# Patient Record
Sex: Female | Born: 1985 | Race: Black or African American | Hispanic: No | Marital: Single | Smoking: Current every day smoker
Health system: Southern US, Community
[De-identification: ages and names within clinical notes are randomized; demographics above are authoritative.]

## PROBLEM LIST (undated history)

## (undated) DIAGNOSIS — F29 Unspecified psychosis not due to a substance or known physiological condition: Secondary | ICD-10-CM

## (undated) DIAGNOSIS — M542 Cervicalgia: Secondary | ICD-10-CM

## (undated) DIAGNOSIS — D219 Benign neoplasm of connective and other soft tissue, unspecified: Secondary | ICD-10-CM

## (undated) DIAGNOSIS — R87619 Unspecified abnormal cytological findings in specimens from cervix uteri: Secondary | ICD-10-CM

## (undated) DIAGNOSIS — N83209 Unspecified ovarian cyst, unspecified side: Secondary | ICD-10-CM

## (undated) DIAGNOSIS — S0990XA Unspecified injury of head, initial encounter: Secondary | ICD-10-CM

## (undated) DIAGNOSIS — K37 Unspecified appendicitis: Secondary | ICD-10-CM

## (undated) HISTORY — PX: KNEE SURGERY: SHX244

---

## 2005-11-12 ENCOUNTER — Emergency Department (HOSPITAL_COMMUNITY): Admission: EM | Admit: 2005-11-12 | Discharge: 2005-11-12 | Payer: Self-pay | Admitting: Emergency Medicine

## 2014-09-28 ENCOUNTER — Observation Stay (HOSPITAL_COMMUNITY)
Admission: EM | Admit: 2014-09-28 | Discharge: 2014-09-29 | Disposition: A | Discharge status: Home / Self Care | Payer: Self-pay | Attending: General Surgery | Admitting: General Surgery

## 2014-09-28 ENCOUNTER — Observation Stay (HOSPITAL_COMMUNITY): Payer: Self-pay | Admitting: Anesthesiology

## 2014-09-28 ENCOUNTER — Emergency Department (HOSPITAL_COMMUNITY): Payer: Self-pay

## 2014-09-28 ENCOUNTER — Encounter (HOSPITAL_COMMUNITY): Payer: Self-pay | Admitting: Family Medicine

## 2014-09-28 ENCOUNTER — Encounter (HOSPITAL_COMMUNITY)
Admission: EM | Disposition: A | Discharge status: Home / Self Care | Payer: Self-pay | Source: Home / Self Care | Attending: Emergency Medicine

## 2014-09-28 DIAGNOSIS — D72829 Elevated white blood cell count, unspecified: Secondary | ICD-10-CM | POA: Insufficient documentation

## 2014-09-28 DIAGNOSIS — E669 Obesity, unspecified: Secondary | ICD-10-CM | POA: Insufficient documentation

## 2014-09-28 DIAGNOSIS — Z9049 Acquired absence of other specified parts of digestive tract: Secondary | ICD-10-CM

## 2014-09-28 DIAGNOSIS — F17210 Nicotine dependence, cigarettes, uncomplicated: Secondary | ICD-10-CM | POA: Insufficient documentation

## 2014-09-28 DIAGNOSIS — K353 Acute appendicitis with localized peritonitis: Principal | ICD-10-CM | POA: Insufficient documentation

## 2014-09-28 DIAGNOSIS — Z6838 Body mass index (BMI) 38.0-38.9, adult: Secondary | ICD-10-CM | POA: Insufficient documentation

## 2014-09-28 DIAGNOSIS — K3580 Unspecified acute appendicitis: Secondary | ICD-10-CM | POA: Diagnosis present

## 2014-09-28 DIAGNOSIS — N946 Dysmenorrhea, unspecified: Secondary | ICD-10-CM | POA: Insufficient documentation

## 2014-09-28 HISTORY — PX: LAPAROSCOPIC APPENDECTOMY: SUR753

## 2014-09-28 HISTORY — DX: Unspecified ovarian cyst, unspecified side: N83.209

## 2014-09-28 HISTORY — DX: Benign neoplasm of connective and other soft tissue, unspecified: D21.9

## 2014-09-28 HISTORY — PX: LAPAROSCOPIC APPENDECTOMY: SHX408

## 2014-09-28 HISTORY — DX: Unspecified appendicitis: K37

## 2014-09-28 LAB — CBC
HCT: 40.5 % (ref 36.0–46.0)
Hemoglobin: 13.6 g/dL (ref 12.0–15.0)
MCH: 30.2 pg (ref 26.0–34.0)
MCHC: 33.6 g/dL (ref 30.0–36.0)
MCV: 89.8 fL (ref 78.0–100.0)
PLATELETS: 321 10*3/uL (ref 150–400)
RBC: 4.51 MIL/uL (ref 3.87–5.11)
RDW: 13.2 % (ref 11.5–15.5)
WBC: 13.8 10*3/uL — ABNORMAL HIGH (ref 4.0–10.5)

## 2014-09-28 LAB — URINE MICROSCOPIC-ADD ON

## 2014-09-28 LAB — URINALYSIS, ROUTINE W REFLEX MICROSCOPIC
BILIRUBIN URINE: NEGATIVE
GLUCOSE, UA: NEGATIVE mg/dL
Hgb urine dipstick: NEGATIVE
KETONES UR: 40 mg/dL — AB
Nitrite: NEGATIVE
PH: 6 (ref 5.0–8.0)
Protein, ur: 30 mg/dL — AB
Specific Gravity, Urine: 1.038 — ABNORMAL HIGH (ref 1.005–1.030)
Urobilinogen, UA: 1 mg/dL (ref 0.0–1.0)

## 2014-09-28 LAB — COMPREHENSIVE METABOLIC PANEL
ALT: 18 U/L (ref 14–54)
AST: 28 U/L (ref 15–41)
Albumin: 4.9 g/dL (ref 3.5–5.0)
Alkaline Phosphatase: 60 U/L (ref 38–126)
Anion gap: 10 (ref 5–15)
BUN: 12 mg/dL (ref 6–20)
CHLORIDE: 104 mmol/L (ref 101–111)
CO2: 23 mmol/L (ref 22–32)
CREATININE: 0.87 mg/dL (ref 0.44–1.00)
Calcium: 9.4 mg/dL (ref 8.9–10.3)
GFR calc Af Amer: >60 mL/min (ref >60)
Glucose, Bld: 120 mg/dL — ABNORMAL HIGH (ref 65–99)
Potassium: 4.2 mmol/L (ref 3.5–5.1)
Sodium: 137 mmol/L (ref 135–145)
Total Bilirubin: 0.7 mg/dL (ref 0.3–1.2)
Total Protein: 8.6 g/dL — ABNORMAL HIGH (ref 6.5–8.1)

## 2014-09-28 LAB — LIPASE, BLOOD: LIPASE: 17 U/L — AB (ref 22–51)

## 2014-09-28 LAB — POC URINE PREG, ED: Preg Test, Ur: NEGATIVE

## 2014-09-28 SURGERY — APPENDECTOMY, LAPAROSCOPIC
Anesthesia: General

## 2014-09-28 MED ORDER — NEOSTIGMINE METHYLSULFATE 10 MG/10ML IV SOLN
INTRAVENOUS | Status: DC | PRN
  Administered 2014-09-28: 4 mg via INTRAVENOUS

## 2014-09-28 MED ORDER — PIPERACILLIN-TAZOBACTAM 3.375 G IVPB
3.3750 g | Freq: Three times a day (TID) | INTRAVENOUS | Status: DC
  Filled 2014-09-28: qty 50

## 2014-09-28 MED ORDER — DEXAMETHASONE SODIUM PHOSPHATE 10 MG/ML IJ SOLN
INTRAMUSCULAR | Status: DC | PRN
  Administered 2014-09-28: 10 mg via INTRAVENOUS

## 2014-09-28 MED ORDER — HYDROMORPHONE HCL 1 MG/ML IJ SOLN
INTRAMUSCULAR | Status: AC
  Filled 2014-09-28: qty 1

## 2014-09-28 MED ORDER — POTASSIUM CHLORIDE IN NACL 20-0.9 MEQ/L-% IV SOLN
INTRAVENOUS | Status: DC
  Filled 2014-09-28 (×2): qty 1000

## 2014-09-28 MED ORDER — LACTATED RINGERS IV SOLN
INTRAVENOUS | Status: DC
Start: 2014-09-28 — End: 2014-09-28

## 2014-09-28 MED ORDER — ROCURONIUM BROMIDE 100 MG/10ML IV SOLN
INTRAVENOUS | Status: AC
  Filled 2014-09-28: qty 1

## 2014-09-28 MED ORDER — BUPIVACAINE LIPOSOME 1.3 % IJ SUSP
20.0000 mL | Freq: Once | INTRAMUSCULAR | Status: DC
  Filled 2014-09-28: qty 20

## 2014-09-28 MED ORDER — HEPARIN SODIUM (PORCINE) 5000 UNIT/ML IJ SOLN
5000.0000 [IU] | Freq: Three times a day (TID) | INTRAMUSCULAR | Status: DC
  Administered 2014-09-28 – 2014-09-29 (×2): 5000 [IU] via SUBCUTANEOUS
  Filled 2014-09-28 (×5): qty 1

## 2014-09-28 MED ORDER — MIDAZOLAM HCL 5 MG/5ML IJ SOLN
INTRAMUSCULAR | Status: DC | PRN
  Administered 2014-09-28: 2 mg via INTRAVENOUS

## 2014-09-28 MED ORDER — MORPHINE SULFATE (PF) 2 MG/ML IV SOLN
1.0000 mg | INTRAVENOUS | Status: DC | PRN
  Administered 2014-09-28 – 2014-09-29 (×4): 1 mg via INTRAVENOUS
  Filled 2014-09-28 (×4): qty 1

## 2014-09-28 MED ORDER — GLYCOPYRROLATE 0.2 MG/ML IJ SOLN
INTRAMUSCULAR | Status: AC
  Filled 2014-09-28: qty 4

## 2014-09-28 MED ORDER — LIDOCAINE HCL (CARDIAC) 20 MG/ML IV SOLN
INTRAVENOUS | Status: DC | PRN
  Administered 2014-09-28: 50 mg via INTRAVENOUS

## 2014-09-28 MED ORDER — DEXAMETHASONE SODIUM PHOSPHATE 10 MG/ML IJ SOLN
INTRAMUSCULAR | Status: AC
  Filled 2014-09-28: qty 1

## 2014-09-28 MED ORDER — ONDANSETRON HCL 4 MG/2ML IJ SOLN
INTRAMUSCULAR | Status: AC
  Filled 2014-09-28: qty 2

## 2014-09-28 MED ORDER — PIPERACILLIN-TAZOBACTAM 3.375 G IVPB 30 MIN
3.3750 g | Freq: Three times a day (TID) | INTRAVENOUS | Status: AC
  Administered 2014-09-28: 3.375 g via INTRAVENOUS
  Filled 2014-09-28: qty 50

## 2014-09-28 MED ORDER — HYDROMORPHONE HCL 1 MG/ML IJ SOLN
1.0000 mg | Freq: Once | INTRAMUSCULAR | Status: AC
  Administered 2014-09-28: 1 mg via INTRAVENOUS
  Filled 2014-09-28: qty 1

## 2014-09-28 MED ORDER — HYDROMORPHONE HCL 2 MG/ML IJ SOLN
INTRAMUSCULAR | Status: AC
  Filled 2014-09-28: qty 1

## 2014-09-28 MED ORDER — ONDANSETRON HCL 4 MG/2ML IJ SOLN
4.0000 mg | Freq: Four times a day (QID) | INTRAMUSCULAR | Status: DC | PRN
  Administered 2014-09-28: 4 mg via INTRAVENOUS

## 2014-09-28 MED ORDER — PIPERACILLIN-TAZOBACTAM 3.375 G IVPB
3.3750 g | Freq: Once | INTRAVENOUS | Status: AC
  Administered 2014-09-28: 3.375 g via INTRAVENOUS
  Filled 2014-09-28: qty 50

## 2014-09-28 MED ORDER — SODIUM CHLORIDE 0.9 % IV BOLUS (SEPSIS)
1000.0000 mL | Freq: Once | INTRAVENOUS | Status: AC
  Administered 2014-09-28: 1000 mL via INTRAVENOUS

## 2014-09-28 MED ORDER — LACTATED RINGERS IR SOLN
Status: DC | PRN
  Administered 2014-09-28: 1000 mL

## 2014-09-28 MED ORDER — METOCLOPRAMIDE HCL 5 MG/ML IJ SOLN
10.0000 mg | INTRAMUSCULAR | Status: AC
  Administered 2014-09-28: 10 mg via INTRAVENOUS
  Filled 2014-09-28: qty 2

## 2014-09-28 MED ORDER — MORPHINE SULFATE (PF) 10 MG/ML IV SOLN: 1.0000 mg | INTRAVENOUS | Status: DC | PRN

## 2014-09-28 MED ORDER — HYDROMORPHONE HCL 1 MG/ML IJ SOLN
0.2500 mg | INTRAMUSCULAR | Status: DC | PRN
  Administered 2014-09-28 (×2): 0.5 mg via INTRAVENOUS

## 2014-09-28 MED ORDER — HYDROCODONE-ACETAMINOPHEN 5-325 MG PO TABS
1.0000 | ORAL_TABLET | ORAL | Status: DC | PRN
  Administered 2014-09-28 – 2014-09-29 (×3): 2 via ORAL
  Filled 2014-09-28 (×3): qty 2

## 2014-09-28 MED ORDER — PROPOFOL 10 MG/ML IV BOLUS
INTRAVENOUS | Status: AC
  Filled 2014-09-28: qty 20

## 2014-09-28 MED ORDER — HYDROMORPHONE HCL 1 MG/ML IJ SOLN
0.5000 mg | INTRAMUSCULAR | Status: DC | PRN
  Administered 2014-09-28: 1 mg via INTRAVENOUS
  Administered 2014-09-28: 0.5 mg via INTRAVENOUS

## 2014-09-28 MED ORDER — BUPIVACAINE LIPOSOME 1.3 % IJ SUSP
INTRAMUSCULAR | Status: DC | PRN
  Administered 2014-09-28: 20 mL

## 2014-09-28 MED ORDER — PANTOPRAZOLE SODIUM 40 MG IV SOLR
40.0000 mg | Freq: Every day | INTRAVENOUS | Status: DC
  Administered 2014-09-28: 40 mg via INTRAVENOUS
  Filled 2014-09-28 (×2): qty 40

## 2014-09-28 MED ORDER — ONDANSETRON 4 MG PO TBDP: 4.0000 mg | ORAL_TABLET | Freq: Four times a day (QID) | ORAL | Status: DC | PRN

## 2014-09-28 MED ORDER — 0.9 % SODIUM CHLORIDE (POUR BTL) OPTIME
TOPICAL | Status: DC | PRN
  Administered 2014-09-28: 1000 mL

## 2014-09-28 MED ORDER — KCL IN DEXTROSE-NACL 20-5-0.45 MEQ/L-%-% IV SOLN
INTRAVENOUS | Status: DC
  Administered 2014-09-28: 16:00:00 via INTRAVENOUS
  Filled 2014-09-28 (×3): qty 1000

## 2014-09-28 MED ORDER — ONDANSETRON HCL 4 MG/2ML IJ SOLN
4.0000 mg | Freq: Four times a day (QID) | INTRAMUSCULAR | Status: DC | PRN
  Administered 2014-09-28: 4 mg via INTRAVENOUS
  Filled 2014-09-28: qty 2

## 2014-09-28 MED ORDER — ROCURONIUM BROMIDE 100 MG/10ML IV SOLN
INTRAVENOUS | Status: DC | PRN
  Administered 2014-09-28: 40 mg via INTRAVENOUS

## 2014-09-28 MED ORDER — FENTANYL CITRATE (PF) 250 MCG/5ML IJ SOLN
INTRAMUSCULAR | Status: AC
  Filled 2014-09-28: qty 25

## 2014-09-28 MED ORDER — LACTATED RINGERS IV SOLN
INTRAVENOUS | Status: DC
  Administered 2014-09-28: 1000 mL via INTRAVENOUS

## 2014-09-28 MED ORDER — IOHEXOL 300 MG/ML  SOLN
50.0000 mL | Freq: Once | INTRAMUSCULAR | Status: AC | PRN
  Administered 2014-09-28: 50 mL via ORAL

## 2014-09-28 MED ORDER — FENTANYL CITRATE (PF) 100 MCG/2ML IJ SOLN
INTRAMUSCULAR | Status: DC | PRN
  Administered 2014-09-28 (×2): 50 ug via INTRAVENOUS
  Administered 2014-09-28: 100 ug via INTRAVENOUS
  Administered 2014-09-28: 50 ug via INTRAVENOUS

## 2014-09-28 MED ORDER — IOHEXOL 300 MG/ML  SOLN
100.0000 mL | Freq: Once | INTRAMUSCULAR | Status: AC | PRN
  Administered 2014-09-28: 100 mL via INTRAVENOUS

## 2014-09-28 MED ORDER — KETOROLAC TROMETHAMINE 60 MG/2ML IM SOLN
60.0000 mg | Freq: Once | INTRAMUSCULAR | Status: AC
  Administered 2014-09-28: 60 mg via INTRAMUSCULAR
  Filled 2014-09-28: qty 2

## 2014-09-28 MED ORDER — LIDOCAINE HCL (CARDIAC) 20 MG/ML IV SOLN
INTRAVENOUS | Status: AC
  Filled 2014-09-28: qty 5

## 2014-09-28 MED ORDER — ONDANSETRON 4 MG PO TBDP
4.0000 mg | ORAL_TABLET | Freq: Once | ORAL | Status: AC | PRN
  Administered 2014-09-28: 4 mg via ORAL
  Filled 2014-09-28: qty 1

## 2014-09-28 MED ORDER — MIDAZOLAM HCL 2 MG/2ML IJ SOLN
INTRAMUSCULAR | Status: AC
  Filled 2014-09-28: qty 4

## 2014-09-28 MED ORDER — GLYCOPYRROLATE 0.2 MG/ML IJ SOLN
INTRAMUSCULAR | Status: DC | PRN
  Administered 2014-09-28: .8 mg via INTRAVENOUS

## 2014-09-28 MED ORDER — PROPOFOL 10 MG/ML IV BOLUS
INTRAVENOUS | Status: DC | PRN
  Administered 2014-09-28: 150 mg via INTRAVENOUS

## 2014-09-28 MED ORDER — SUCCINYLCHOLINE CHLORIDE 20 MG/ML IJ SOLN
INTRAMUSCULAR | Status: DC | PRN
  Administered 2014-09-28: 100 mg via INTRAVENOUS

## 2014-09-28 SURGICAL SUPPLY — 40 items
APPLIER CLIP ROT 10 11.4 M/L (STAPLE)
APR CLP MED LRG 11.4X10 (STAPLE)
BAG SPEC RTRVL 10 TROC 200 (ENDOMECHANICALS)
BAG SPEC RTRVL LRG 6X4 10 (ENDOMECHANICALS)
CABLE HIGH FREQUENCY MONO STRZ (ELECTRODE) IMPLANT
CLIP APPLIE ROT 10 11.4 M/L (STAPLE) IMPLANT
COVER SURGICAL LIGHT HANDLE (MISCELLANEOUS) ×3 IMPLANT
CUTTER FLEX LINEAR 45M (STAPLE) ×2 IMPLANT
DECANTER SPIKE VIAL GLASS SM (MISCELLANEOUS) ×1 IMPLANT
DRAPE LAPAROSCOPIC ABDOMINAL (DRAPES) ×3 IMPLANT
ELECT REM PT RETURN 9FT ADLT (ELECTROSURGICAL) ×3
ELECTRODE REM PT RTRN 9FT ADLT (ELECTROSURGICAL) ×1 IMPLANT
ENDOLOOP SUT PDS II  0 18 (SUTURE)
ENDOLOOP SUT PDS II 0 18 (SUTURE) IMPLANT
GLOVE BIOGEL M 8.0 STRL (GLOVE) ×3 IMPLANT
GOWN STRL REUS W/TWL XL LVL3 (GOWN DISPOSABLE) ×6 IMPLANT
HOVERMATT SINGLE USE (MISCELLANEOUS) ×2 IMPLANT
KIT BASIN OR (CUSTOM PROCEDURE TRAY) ×3 IMPLANT
LIQUID BAND (GAUZE/BANDAGES/DRESSINGS) ×2 IMPLANT
POUCH RETRIEVAL ECOSAC 10 (ENDOMECHANICALS) IMPLANT
POUCH RETRIEVAL ECOSAC 10MM (ENDOMECHANICALS)
POUCH SPECIMEN RETRIEVAL 10MM (ENDOMECHANICALS) IMPLANT
RELOAD 45 VASCULAR/THIN (ENDOMECHANICALS) ×3 IMPLANT
RELOAD STAPLE 45 2.5 WHT GRN (ENDOMECHANICALS) IMPLANT
RELOAD STAPLE 45 3.5 BLU ETS (ENDOMECHANICALS) IMPLANT
RELOAD STAPLE TA45 3.5 REG BLU (ENDOMECHANICALS) IMPLANT
SCISSORS LAP 5X45 EPIX DISP (ENDOMECHANICALS) ×2 IMPLANT
SCRUB PCMX 4 OZ (MISCELLANEOUS) ×3 IMPLANT
SET IRRIG TUBING LAPAROSCOPIC (IRRIGATION / IRRIGATOR) ×3 IMPLANT
SHEARS CURVED HARMONIC AC 45CM (MISCELLANEOUS) ×3 IMPLANT
SLEEVE XCEL OPT CAN 5 100 (ENDOMECHANICALS) IMPLANT
STAPLER VISISTAT 35W (STAPLE) IMPLANT
SUT VIC AB 4-0 SH 18 (SUTURE) ×3 IMPLANT
SUT VICRYL 0 UR6 27IN ABS (SUTURE) ×2 IMPLANT
TOWEL OR 17X26 10 PK STRL BLUE (TOWEL DISPOSABLE) ×6 IMPLANT
TRAY FOLEY W/METER SILVER 14FR (SET/KITS/TRAYS/PACK) ×3 IMPLANT
TRAY LAPAROSCOPIC (CUSTOM PROCEDURE TRAY) ×3 IMPLANT
TROCAR BLADELESS OPT 5 100 (ENDOMECHANICALS) ×3 IMPLANT
TROCAR XCEL BLUNT TIP 100MML (ENDOMECHANICALS) ×3 IMPLANT
TROCAR XCEL NON-BLD 11X100MML (ENDOMECHANICALS) IMPLANT

## 2014-09-28 NOTE — H&P (Signed)
Judy Richardson is an 29 y.o. female.   Chief Complaint: Abdominal pain, nausea and vomting PCP:  None  HPI: Pt is a 29 y/o female who has pain lasting up to 2 days every couple months with her menstrual periods.She says these last 1-2 days and she just gets thru it although she says sometimes they get so bad she can't get up.  Her pain started Wed 09/23/14, and she thought she was just having a bad period.  She works in a warehouse, and felt some of it might just be from being tired. She has not been able to eat much since it started on 09/23/14.  She says the pain comes and goes. She says she has been tired a irritable, so she tried to get some extra sleep and just get thru it.   Pain is primarily in the LLQ, that is also where she usually has menstrual pain. She tried to eat some dinner yesterday and developed nausea and vomiting.  She says this is a little different than other cycle type pain.  She has also had some urgency but unable to void much with the feeling. She has had some constipation followed by loose stools with this also and that is a new symptom.  She finally presented to the ED here at [facility] last PM.  Work up shows she is afebrile, VSS, labs show her WBC is 13.8, UA shows many WBC.  CT scan shows: the appendix is dilated measuring 1.2 cm, fluid-filled with periappendiceal inflammatory change proximally. The appendix courses medially with tip in the deep pelvis. Small appendicoliths noted within the appendiceal lumen. No adjacent fluid collection or extraluminal air to suggest perforation or abscess. There is a 4 cm rounded myometrial lesion arising from the posterior uterine fundus, likely fibroid. Questionable 1.8 cm cyst in the left ovary, right ovary not discretely visualized. There is a small amount of free fluid in the pelvis.  Subcapsular 12 mm hypodense lesion in the right lobe of the liver. This is incompletely characterize, may reflect a hemangioma. We are ask to see.  Past Medical History   Diagnosis Date  . Ovarian cyst     Past Surgical History  Procedure Laterality Date  . Knee surgery Right     History reviewed. No pertinent family history. Social History:  reports that she has been smoking Cigarettes.  She does not have any smokeless tobacco history on file. She reports that she drinks alcohol. She reports that she uses illicit drugs (Marijuana). Tobacco:  Since age 33 ETOH:  Social Drugs:  Frequent Marijuana  Works in a [employer].  Allergies: No Known Allergies  Prior to Admission medications   None      Results for orders placed or performed during the hospital encounter of 09/28/14 (from the past 48 hour(s))  Lipase, blood     Status: Abnormal   Collection Time: 09/28/14  2:33 AM  Result Value Ref Range   Lipase 17 (L) 22 - 51 U/L  Comprehensive metabolic panel     Status: Abnormal   Collection Time: 09/28/14  2:33 AM  Result Value Ref Range   Sodium 137 135 - 145 mmol/L   Potassium 4.2 3.5 - 5.1 mmol/L   Chloride 104 101 - 111 mmol/L   CO2 23 22 - 32 mmol/L   Glucose, Bld 120 (H) 65 - 99 mg/dL   BUN 12 6 - 20 mg/dL   Creatinine, Ser 0.87 0.44 - 1.00 mg/dL   Calcium 9.4 8.9 -  10.3 mg/dL   Total Protein 8.6 (H) 6.5 - 8.1 g/dL   Albumin 4.9 3.5 - 5.0 g/dL   AST 28 15 - 41 U/L   ALT 18 14 - 54 U/L   Alkaline Phosphatase 60 38 - 126 U/L   Total Bilirubin 0.7 0.3 - 1.2 mg/dL   GFR calc non Af Amer >60 >60 mL/min   GFR calc Af Amer >60 >60 mL/min    Comment: (NOTE) The eGFR has been calculated using the CKD EPI equation. This calculation has not been validated in all clinical situations. eGFR's persistently <60 mL/min signify possible Chronic Kidney Disease.    Anion gap 10 5 - 15  CBC     Status: Abnormal   Collection Time: 09/28/14  2:33 AM  Result Value Ref Range   WBC 13.8 (H) 4.0 - 10.5 K/uL   RBC 4.51 3.87 - 5.11 MIL/uL   Hemoglobin 13.6 12.0 - 15.0 g/dL   HCT 40.5 36.0 - 46.0 %   MCV 89.8 78.0 - 100.0 fL   MCH 30.2 26.0 - 34.0 pg    MCHC 33.6 30.0 - 36.0 g/dL   RDW 13.2 11.5 - 15.5 %   Platelets 321 150 - 400 K/uL  Urinalysis, Routine w reflex microscopic (not at [facility])     Status: Abnormal   Collection Time: 09/28/14  4:57 AM  Result Value Ref Range   Color, Urine AMBER (A) YELLOW    Comment: BIOCHEMICALS MAY BE AFFECTED BY COLOR   APPearance TURBID (A) CLEAR   Specific Gravity, Urine 1.038 (H) 1.005 - 1.030   pH 6.0 5.0 - 8.0   Glucose, UA NEGATIVE NEGATIVE mg/dL   Hgb urine dipstick NEGATIVE NEGATIVE   Bilirubin Urine NEGATIVE NEGATIVE   Ketones, ur 40 (A) NEGATIVE mg/dL   Protein, ur 30 (A) NEGATIVE mg/dL   Urobilinogen, UA 1.0 0.0 - 1.0 mg/dL   Nitrite NEGATIVE NEGATIVE   Leukocytes, UA SMALL (A) NEGATIVE  Urine microscopic-add on     Status: Abnormal   Collection Time: 09/28/14  4:57 AM  Result Value Ref Range   Squamous Epithelial / LPF MANY (A) RARE   WBC, UA 21-50 <3 WBC/hpf   Bacteria, UA FEW (A) RARE   Urine-Other MUCOUS PRESENT   POC urine preg, ED (not at [facility])     Status: None   Collection Time: 09/28/14  5:02 AM  Result Value Ref Range   Preg Test, Ur NEGATIVE NEGATIVE    Comment:        THE SENSITIVITY OF THIS METHODOLOGY IS >24 mIU/mL    Ct Abdomen Pelvis W Contrast  09/28/2014   CLINICAL DATA:  Abdominal pain. Left lower quadrant and low back pain, intermittent for 4 days. Nausea and vomiting. Weight loss.  EXAM: CT ABDOMEN AND PELVIS WITH CONTRAST  TECHNIQUE: Multidetector CT imaging of the abdomen and pelvis was performed using the standard protocol following bolus administration of intravenous contrast.  CONTRAST:  127m OMNIPAQUE IOHEXOL 300 MG/ML  SOLN  COMPARISON:  None.  FINDINGS: Lower chest:  The included lung bases are clear.  Liver: Subcapsular 12 mm hypodensity in the right lobe of the liver, incompletely characterized. Liver is otherwise normal in size and density.  Hepatobiliary: Gallbladder physiologically distended. No biliary dilatation.  Pancreas: Normal.  Spleen: Normal.   Adrenal glands: No nodule.  Kidneys: Symmetric renal enhancement. No hydronephrosis. No localizing renal abnormality.  Stomach/Bowel: Stomach physiologically distended. There are no dilated or thickened small bowel loops. Small volume of  stool throughout the colon without colonic wall thickening.  Appendix: Dilated measuring 1.2 cm, fluid-filled with periappendiceal inflammatory change proximally. The appendix courses medially with tip in the deep pelvis. Small appendicoliths noted within the appendiceal lumen. No adjacent fluid collection or extraluminal air to suggest perforation or abscess.  Vascular/Lymphatic: No retroperitoneal adenopathy. Abdominal aorta is normal in caliber.  Reproductive: There is a 4 cm rounded myometrial lesion arising from the posterior uterine fundus, likely fibroid. Questionable 1.8 cm cyst in the left ovary, right ovary not discretely visualized. There is a small amount of free fluid in the pelvis.  Bladder: Decompressed.  Other: No free air or intra-abdominal fluid collection.  Musculoskeletal: There are no acute or suspicious osseous abnormalities.  IMPRESSION: 1. Findings consistent with acute appendicitis.  No perforation. 2. Probable uterine fibroid. 3. Subcapsular 12 mm hypodense lesion in the right lobe of the liver. This is incompletely characterize, may reflect a hemangioma. Nonemergent characterization with hepatic protocol MRI recommended after acute incident has resolved.   Electronically Signed   By: Jeb Levering M.D.   On: 09/28/2014 06:41    Review of Systems  Constitutional: Positive for fever (she did not measure at home but felt feverish), chills and weight loss (7-10 labs last 2 weeks). Negative for malaise/fatigue and diaphoresis.  HENT: Negative.   Eyes: Negative.   Respiratory: Negative.   Cardiovascular: Negative.   Gastrointestinal: Positive for heartburn (occasional, depending on what she eats), nausea, vomiting, abdominal pain and constipation  (New issue followed by loose stools.).  Genitourinary: Positive for urgency and frequency.       She feels like she has to go, but small volumes.  Musculoskeletal: Positive for back pain.  Skin: Negative.   Neurological: Negative.  Negative for weakness.  Endo/Heme/Allergies: Negative.   Psychiatric/Behavioral: Negative.     Blood pressure 108/57, pulse 73, temperature 98.1 F (36.7 C), temperature source Oral, resp. rate 20, height '5\' 5"'  (1.651 m), weight 104.327 kg (230 lb), last menstrual period 08/30/2014, SpO2 99 %. Physical Exam  Constitutional: She is oriented to person, place, and time. She appears well-developed and well-nourished. No distress.  HENT:  Head: Normocephalic and atraumatic.  Nose: Nose normal.  Eyes: Conjunctivae and EOM are normal. Right eye exhibits no discharge. Left eye exhibits no discharge. No scleral icterus.  Neck: Normal range of motion. Neck supple. No JVD present. No tracheal deviation present. No thyromegaly present.  Cardiovascular: Normal rate, regular rhythm and intact distal pulses.   No murmur heard. Respiratory: Effort normal and breath sounds normal. No respiratory distress. She has no wheezes. She has no rales. She exhibits no tenderness.  GI: Soft. She exhibits no distension and no mass. There is tenderness. There is no rebound and no guarding.  She is tender all over, left and right lower quadrants are the worst, she points to the left side more than the right.  Musculoskeletal: She exhibits no edema.  Lymphadenopathy:    She has no cervical adenopathy.  Neurological: She is alert and oriented to person, place, and time. No cranial nerve deficit.  Skin: Skin is warm and dry. No rash noted. She is not diaphoretic. No erythema. No pallor.  Psychiatric: She has a normal mood and affect. Her behavior is normal. Judgment and thought content normal.     Assessment/Plan Acute appendicitis UTI Chronic dysmenorrhea Probable uterine fibroid and  left ovarian cyst Tobacco use  Plan:  Admit for acute appendicitis and probable UTI.  We had started her on Zosyn  in the ED.  We will hydrate, and take to the OR later today.  Urine culture is pending.  Dr. Hassell Done has seen and examined her.  He discussed surgery along with the risk and benefits of surgery.  JENNINGS,WILLARD 09/28/2014, 7:56 AM   I have seen and examined this patient and agree with the assessment and plan.   Matt B. Hassell Done, MD, [facility] [phone] beeper [phone]  09/28/2014 11:09 AM

## 2014-09-28 NOTE — Transfer of Care (Signed)
Immediate Anesthesia Transfer of Care Note  Patient: Judy Richardson  Procedure(s) Performed: Procedure(s): APPENDECTOMY LAPAROSCOPIC (N/A)  Patient Location: PACU  Anesthesia Type:General  Level of Consciousness:  sedated, patient cooperative and responds to stimulation  Airway & Oxygen Therapy:Patient Spontanous Breathing and Patient connected to face mask oxgen  Post-op Assessment:  Report given to PACU RN and Post -op Vital signs reviewed and stable  Post vital signs:  Reviewed and stable  Last Vitals:  Filed Vitals:   09/28/14 1000  BP: 110/55  Pulse: 82  Temp: 36.8 C  Resp: 16    Complications: No apparent anesthesia complications

## 2014-09-28 NOTE — Anesthesia Procedure Notes (Signed)
Procedure Name: Intubation Date/Time: 09/28/2014 12:00 PM Performed by: Anne Fu Pre-anesthesia Checklist: Patient identified, Emergency Drugs available, Suction available, Patient being monitored and Timeout performed Patient Re-evaluated:Patient Re-evaluated prior to inductionOxygen Delivery Method: Circle system utilized Preoxygenation: Pre-oxygenation with 100% oxygen Intubation Type: IV induction Ventilation: Mask ventilation without difficulty Laryngoscope Size: Mac and 3 Grade View: Grade I Tube type: Oral Tube size: 7.5 mm Number of attempts: 1 Airway Equipment and Method: Stylet Placement Confirmation: ETT inserted through vocal cords under direct vision,  positive ETCO2,  CO2 detector and breath sounds checked- equal and bilateral Secured at: 21 cm Tube secured with: Tape Dental Injury: Teeth and Oropharynx as per pre-operative assessment

## 2014-09-28 NOTE — ED Notes (Signed)
Pt aware that a urine sample is needed. Pt sts she will go after pain meds kick in.

## 2014-09-28 NOTE — ED Notes (Signed)
Surgery at bedside.

## 2014-09-28 NOTE — ED Notes (Signed)
Patient is complaining of left lower abd pain that has been intermittently painful since Wednesday. Also, complaining lower back pain, nausea, and vomiting.

## 2014-09-28 NOTE — Anesthesia Preprocedure Evaluation (Signed)
Anesthesia Evaluation  Patient identified by MRN, date of birth, ID band Patient awake    Reviewed: Allergy & Precautions, H&P , NPO status , Patient's Chart, lab work & pertinent test results  Airway Mallampati: II  TM Distance: >3 FB Neck ROM: full    Dental no notable dental hx. (+) Dental Advisory Given, Teeth Intact   Pulmonary neg pulmonary ROS, Current Smoker,    Pulmonary exam normal breath sounds clear to auscultation       Cardiovascular Exercise Tolerance: Good negative cardio ROS Normal cardiovascular exam Rhythm:regular Rate:Normal     Neuro/Psych negative neurological ROS  negative psych ROS   GI/Hepatic negative GI ROS, Neg liver ROS,   Endo/Other  negative endocrine ROS  Renal/GU negative Renal ROS  negative genitourinary   Musculoskeletal   Abdominal   Peds  Hematology negative hematology ROS (+)   Anesthesia Other Findings   Reproductive/Obstetrics negative OB ROS                             Anesthesia Physical Anesthesia Plan  ASA: II and emergent  Anesthesia Plan: General   Post-op Pain Management:    Induction: Intravenous, Rapid sequence and Cricoid pressure planned  Airway Management Planned: Oral ETT  Additional Equipment:   Intra-op Plan:   Post-operative Plan: Extubation in OR  Informed Consent: I have reviewed the patients History and Physical, chart, labs and discussed the procedure including the risks, benefits and alternatives for the proposed anesthesia with the patient or authorized representative who has indicated his/her understanding and acceptance.   Dental Advisory Given  Plan Discussed with: CRNA and Surgeon  Anesthesia Plan Comments:         Anesthesia Quick Evaluation

## 2014-09-28 NOTE — ED Notes (Signed)
Spoke with Amy on 5W. Patient can be transported @ 0835.

## 2014-09-28 NOTE — Anesthesia Postprocedure Evaluation (Signed)
  Anesthesia Post-op Note  Patient: Judy Richardson  Procedure(s) Performed: Procedure(s) (LRB): APPENDECTOMY LAPAROSCOPIC (N/A)  Patient Location: PACU  Anesthesia Type: General  Level of Consciousness: awake and alert   Airway and Oxygen Therapy: Patient Spontanous Breathing  Post-op Pain: mild  Post-op Assessment: Post-op Vital signs reviewed, Patient's Cardiovascular Status Stable, Respiratory Function Stable, Patent Airway and No signs of Nausea or vomiting  Last Vitals:  Filed Vitals:   09/28/14 1429  BP: 121/65  Pulse: 79  Temp: 36.8 C  Resp: 16    Post-op Vital Signs: stable   Complications: No apparent anesthesia complications

## 2014-09-28 NOTE — ED Provider Notes (Signed)
CSN: [id number]     Arrival date & time 09/28/14  0100 History   First MD Initiated Contact with Patient 09/28/14 [phone]     Chief Complaint  Patient presents with  . Abdominal Pain     (Consider location/radiation/quality/duration/timing/severity/associated sxs/prior Treatment) HPI Comments: 29 year old female with a history of left-sided ovarian cyst presents to the emergency department for left lower abdominal pain. Pain has been intermittent over the past 5 days, but constant over the last 24 hours. Pain has been worsening since onset and associated with low back pain. Patient has experienced nausea and emesis since 2200 this evening. She has had approximately 5 episodes of emesis, all of which have been nonbloody. She reports some mild dysuria and denies any fever. She further denies shortness of breath, hematuria, vaginal complaints, melanoma, or hematochezia. Patient denies a history of abdominal surgeries. No medications taken prior to arrival for symptoms.  Patient is a 29 y.o. female presenting with abdominal pain. The history is provided by the patient and a relative. No language interpreter was used.  Abdominal Pain Associated symptoms: dysuria, nausea and vomiting   Associated symptoms: no diarrhea, no fever, no shortness of breath, no vaginal bleeding and no vaginal discharge     Past Medical History  Diagnosis Date  . Ovarian cyst    Past Surgical History  Procedure Laterality Date  . Knee surgery Right    History reviewed. No pertinent family history. Social History  Substance Use Topics  . Smoking status: Current Every Day Smoker    Types: Cigarettes  . Smokeless tobacco: None  . Alcohol Use: Yes     Comment: Once a month   OB History    No data available      Review of Systems  Constitutional: Negative for fever.  Respiratory: Negative for shortness of breath.   Gastrointestinal: Positive for nausea, vomiting and abdominal pain. Negative for diarrhea and  blood in stool.  Genitourinary: Positive for dysuria. Negative for vaginal bleeding and vaginal discharge.  Musculoskeletal: Positive for back pain.  Neurological: Negative for syncope.  All other systems reviewed and are negative.   Allergies  Review of patient's allergies indicates no known allergies.  Home Medications   Prior to Admission medications   Not on File   BP 108/57 mmHg  Pulse 73  Temp(Src) 98.1 F (36.7 C) (Oral)  Resp 20  Ht 5\' 5"  (1.651 m)  Wt 230 lb (104.327 kg)  BMI 38.27 kg/m2  SpO2 99%  LMP 08/30/2014   Physical Exam  Constitutional: She is oriented to person, place, and time. She appears well-developed and well-nourished. No distress.  Patient appears uncomfortable, rolling around in the exam room bed  HENT:  Head: Normocephalic and atraumatic.  Eyes: Conjunctivae and EOM are normal. No scleral icterus.  Neck: Normal range of motion.  Cardiovascular: Normal rate, regular rhythm and intact distal pulses.   Pulmonary/Chest: Effort normal. No respiratory distress. She has no wheezes.  Respirations even and unlabored  Abdominal: Soft. She exhibits no distension. There is tenderness. There is no rebound and no guarding.    Soft obese abdomen without masses or peritoneal signs. There is focal TTP in the RLQ and LLQ.   Musculoskeletal: Normal range of motion.  Neurological: She is alert and oriented to person, place, and time. She exhibits normal muscle tone. Coordination normal.  GCS 15. Speech is goal oriented. Patient moves extremities without ataxia.  Skin: Skin is warm and dry. No rash noted. She is not diaphoretic.  No erythema. No pallor.  Psychiatric: She has a normal mood and affect. Her behavior is normal.  Nursing note and vitals reviewed.   ED Course  Procedures (including critical care time) Labs Review Labs Reviewed  LIPASE, BLOOD - Abnormal; Notable for the following:    Lipase 17 (*)    All other components within normal limits    COMPREHENSIVE METABOLIC PANEL - Abnormal; Notable for the following:    Glucose, Bld 120 (*)    Total Protein 8.6 (*)    All other components within normal limits  CBC - Abnormal; Notable for the following:    WBC 13.8 (*)    All other components within normal limits  URINALYSIS, ROUTINE W REFLEX MICROSCOPIC (NOT AT [facility]) - Abnormal; Notable for the following:    Color, Urine AMBER (*)    APPearance TURBID (*)    Specific Gravity, Urine 1.038 (*)    Ketones, ur 40 (*)    Protein, ur 30 (*)    Leukocytes, UA SMALL (*)    All other components within normal limits  URINE MICROSCOPIC-ADD ON - Abnormal; Notable for the following:    Squamous Epithelial / LPF MANY (*)    Bacteria, UA FEW (*)    All other components within normal limits  URINE CULTURE  POC URINE PREG, ED    Imaging Review Ct Abdomen Pelvis W Contrast  09/28/2014   CLINICAL DATA:  Abdominal pain. Left lower quadrant and low back pain, intermittent for 4 days. Nausea and vomiting. Weight loss.  EXAM: CT ABDOMEN AND PELVIS WITH CONTRAST  TECHNIQUE: Multidetector CT imaging of the abdomen and pelvis was performed using the standard protocol following bolus administration of intravenous contrast.  CONTRAST:  166mL OMNIPAQUE IOHEXOL 300 MG/ML  SOLN  COMPARISON:  None.  FINDINGS: Lower chest:  The included lung bases are clear.  Liver: Subcapsular 12 mm hypodensity in the right lobe of the liver, incompletely characterized. Liver is otherwise normal in size and density.  Hepatobiliary: Gallbladder physiologically distended. No biliary dilatation.  Pancreas: Normal.  Spleen: Normal.  Adrenal glands: No nodule.  Kidneys: Symmetric renal enhancement. No hydronephrosis. No localizing renal abnormality.  Stomach/Bowel: Stomach physiologically distended. There are no dilated or thickened small bowel loops. Small volume of stool throughout the colon without colonic wall thickening.  Appendix: Dilated measuring 1.2 cm, fluid-filled with  periappendiceal inflammatory change proximally. The appendix courses medially with tip in the deep pelvis. Small appendicoliths noted within the appendiceal lumen. No adjacent fluid collection or extraluminal air to suggest perforation or abscess.  Vascular/Lymphatic: No retroperitoneal adenopathy. Abdominal aorta is normal in caliber.  Reproductive: There is a 4 cm rounded myometrial lesion arising from the posterior uterine fundus, likely fibroid. Questionable 1.8 cm cyst in the left ovary, right ovary not discretely visualized. There is a small amount of free fluid in the pelvis.  Bladder: Decompressed.  Other: No free air or intra-abdominal fluid collection.  Musculoskeletal: There are no acute or suspicious osseous abnormalities.  IMPRESSION: 1. Findings consistent with acute appendicitis.  No perforation. 2. Probable uterine fibroid. 3. Subcapsular 12 mm hypodense lesion in the right lobe of the liver. This is incompletely characterize, may reflect a hemangioma. Nonemergent characterization with hepatic protocol MRI recommended after acute incident has resolved.   Electronically Signed   By: Jeb Levering M.D.   On: 09/28/2014 06:41     I have personally reviewed and evaluated these images and lab results as part of my medical decision-making.  EKG Interpretation None      MDM   Final diagnoses:  Acute appendicitis, unspecified acute appendicitis type    29 y/o female presents to the ED for evaluation of abdominal pain; waxing and waning x 5 days, but constant x 24 hours. Symptoms associated with nausea and emesis x 5 PTA. Pain controlled with Dilaudid. Patient afebrile. She has a leukocytosis of 13.8 today. CT shows acute appendicitis. Suspect that pyuria is associated with appendicitis. Patient has no urinary symptoms.  Case discussed with Dr. Marcello Moores of [place]. Patient to be evaluated by oncoming surgical team. Anticipate admission for laparoscopic appendectomy. Last PO intake (food and  fluids) was at 2000 yesterday.   Filed Vitals:   09/28/14 0118 09/28/14 0607  BP: 126/70 108/57  Pulse: 87 73  Temp: 98.1 F (36.7 C)   TempSrc: Oral   Resp: 18 20  Height: 5\' 5"  (1.651 m)   Weight: 230 lb (104.327 kg)   SpO2: 100% 99%     Antonietta Breach, PA-C 09/28/14 5449  Varney Biles, MD 09/28/14 0700

## 2014-09-28 NOTE — Op Note (Signed)
Surgeon: Kaylyn Lim, MD, FACS  Asst:  none  Anes:  general  Procedure: Laparoscopic appendectomy  Diagnosis: Acute appendicitis  Complications: none  EBL:   minimal cc  Drains: none  Description of Procedure:  The patient was taken to OR 6 at [facility].  After anesthesia was administered and the patient was prepped a timeout was performed.  Hasson placed through the umbilicus.  5 mm trocar placed in the right upper quadrant and left lower quadrant.  The appendix was mobilized from laterally and there was yellow cloudy fluid around it.  The mesentery of the appendix was divided with the harmonic.  The base of the appendix was isolated and transected with the Ethicon 4.5 mm stapler with a white load.  The appendix was removed through the umbilicus.  Ports were injected with Exparel.  The umbilicus was repaired with 2 sutures of 0 vicryl.  Liquiban was used on the skin.    The patient tolerated the procedure well and was taken to the PACU in stable condition.     Matt B. Hassell Done, [place], [facility], [place]

## 2014-09-29 ENCOUNTER — Encounter (HOSPITAL_COMMUNITY): Payer: Self-pay | Admitting: General Surgery

## 2014-09-29 DIAGNOSIS — K3580 Unspecified acute appendicitis: Secondary | ICD-10-CM | POA: Diagnosis present

## 2014-09-29 LAB — CBC
HEMATOCRIT: 35.9 % — AB (ref 36.0–46.0)
HEMOGLOBIN: 12.1 g/dL (ref 12.0–15.0)
MCH: 30.5 pg (ref 26.0–34.0)
MCHC: 33.7 g/dL (ref 30.0–36.0)
MCV: 90.4 fL (ref 78.0–100.0)
Platelets: 266 10*3/uL (ref 150–400)
RBC: 3.97 MIL/uL (ref 3.87–5.11)
RDW: 13.4 % (ref 11.5–15.5)
WBC: 11.8 10*3/uL — AB (ref 4.0–10.5)

## 2014-09-29 LAB — URINE CULTURE

## 2014-09-29 MED ORDER — KETOROLAC TROMETHAMINE 30 MG/ML IJ SOLN: 30.0000 mg | Freq: Four times a day (QID) | INTRAMUSCULAR | Status: DC

## 2014-09-29 MED ORDER — METHOCARBAMOL 500 MG PO TABS: 1000.0000 mg | ORAL_TABLET | Freq: Three times a day (TID) | ORAL | Status: DC | PRN

## 2014-09-29 MED ORDER — HYDROCODONE-ACETAMINOPHEN 5-325 MG PO TABS: 1.0000 | ORAL_TABLET | Freq: Four times a day (QID) | ORAL | Status: DC | PRN

## 2014-09-29 NOTE — Discharge Instructions (Signed)

## 2014-09-29 NOTE — Discharge Summary (Signed)
[facility] Discharge Summary   Patient ID: Judy Richardson MRN: [id number] DOB/AGE: 1985/06/17 29 y.o.  Admit date: 09/28/2014 Discharge date: 09/29/2014  Admitting Diagnosis: ACUTE APPENDICITIS  Discharge Diagnosis Patient Active Problem List   Diagnosis Date Noted  . Acute appendicitis 09/29/2014  . S/P laparoscopic appendectomy Sept 2016 09/28/2014    Consultants N/A  Imaging: Ct Abdomen Pelvis W Contrast  09/28/2014   CLINICAL DATA:  Abdominal pain. Left lower quadrant and low back pain, intermittent for 4 days. Nausea and vomiting. Weight loss.  EXAM: CT ABDOMEN AND PELVIS WITH CONTRAST  TECHNIQUE: Multidetector CT imaging of the abdomen and pelvis was performed using the standard protocol following bolus administration of intravenous contrast.  CONTRAST:  19mL OMNIPAQUE IOHEXOL 300 MG/ML  SOLN  COMPARISON:  None.  FINDINGS: Lower chest:  The included lung bases are clear.  Liver: Subcapsular 12 mm hypodensity in the right lobe of the liver, incompletely characterized. Liver is otherwise normal in size and density.  Hepatobiliary: Gallbladder physiologically distended. No biliary dilatation.  Pancreas: Normal.  Spleen: Normal.  Adrenal glands: No nodule.  Kidneys: Symmetric renal enhancement. No hydronephrosis. No localizing renal abnormality.  Stomach/Bowel: Stomach physiologically distended. There are no dilated or thickened small bowel loops. Small volume of stool throughout the colon without colonic wall thickening.  Appendix: Dilated measuring 1.2 cm, fluid-filled with periappendiceal inflammatory change proximally. The appendix courses medially with tip in the deep pelvis. Small appendicoliths noted within the appendiceal lumen. No adjacent fluid collection or extraluminal air to suggest perforation or abscess.  Vascular/Lymphatic: No retroperitoneal adenopathy. Abdominal aorta is normal in caliber.  Reproductive: There is a 4 cm rounded myometrial lesion arising from  the posterior uterine fundus, likely fibroid. Questionable 1.8 cm cyst in the left ovary, right ovary not discretely visualized. There is a small amount of free fluid in the pelvis.  Bladder: Decompressed.  Other: No free air or intra-abdominal fluid collection.  Musculoskeletal: There are no acute or suspicious osseous abnormalities.  IMPRESSION: 1. Findings consistent with acute appendicitis.  No perforation. 2. Probable uterine fibroid. 3. Subcapsular 12 mm hypodense lesion in the right lobe of the liver. This is incompletely characterize, may reflect a hemangioma. Nonemergent characterization with hepatic protocol MRI recommended after acute incident has resolved.   Electronically Signed   By: Jeb Levering M.D.   On: 09/28/2014 06:41    Procedures Dr. Johnathan Hausen (09/28/14) - Laparoscopic Appendectomy  Hospital Course:  28 y/o female with a PMH of dysmenorrhea presented to [facility] with worsening abdominal pain for 5 days, worsening the past 2 days. Her pain started Wed 09/23/14 and she thought she was just having a bad period. She works in a warehouse, and felt some of it might just be exhaustion. She has not been able to eat much since the pain started. She says the pain comes and goes. Pain at presentation was primarily in the LLQ, which is where she usually has menstrual pain. She described the pain as slightly "different" than the pain that she usually associates with her menstrual cycle. She developed nausea and vomiting on 09/27/14 after eating a meal. She also expressed new urinary urgency and constipation followed by loose stools.  ED Workup showed her to be febrile with WBC 13.8. UA positive for leukocytes. CT of abdomen and pelvis showed a dilated appendix measuring 1.2 cm, fluid-filled with periappendiceal inflammatory change proximally, and small appendicoliths noted within the appendiceal lumen. CT also showed a 4cm myometrial lesion on the posterior uterine  fundus and a questionable 1.8  cm cystic structure on the left ovary.  Patient was admitted and underwent procedure listed above.  Tolerated procedure well and was transferred to the floor. Diet was advanced as tolerated. On POD1, the patient was voiding well, tolerating diet, ambulating well, pain well controlled, vital signs stable, passing flatus, incisions c/d/i and felt stable for discharge home.  Patient will follow up in our office in 2 weeks and knows to call with questions or concerns.  She will call to confirm appointment date/time. A work note has been provided for her along with her post-op care instructions.  Patient was also encouraged to see her PCP for referral to an OB/Gyn in order to follow-up on menstrual pain and possible intrauterine fibroids.  Physical Exam: General:  Alert, NAD, pleasant, comfortable Abd: Soft, ND, mild tenderness around incision sites, incisions C/D/I CV: RRR, no m/r/g, radial pulses 2+ BL Pulm: CTABL, no wheezes, rales, or rhonchi    Medication List    TAKE these medications        HYDROcodone-acetaminophen 5-325 MG per tablet  Commonly known as:  NORCO/VICODIN  Take 1-2 tablets by mouth every 6 (six) hours as needed for moderate pain.     methocarbamol 500 MG tablet  Commonly known as:  ROBAXIN  Take 2 tablets (1,000 mg total) by mouth every 8 (eight) hours as needed for muscle spasms (or pain).            Follow-up Information    Follow up with [facility] on 10/13/2014.   Why:  For post-operation check.  Your appointment is at 1:30pm, please arrive at least 30 min before your appointment to complete your check in paperwork.  If you are unable to arrive 25min prior to your appointment time we may have to cancel or reschedule you   Contact information:   [address] [place] [address] [phone]      Signed: Rose Phi, PA-S Physician Assistant Student, [facility]  Surgery [phone]   09/29/2014, 8:54 AM

## 2014-09-29 NOTE — Progress Notes (Signed)
Patient's vital signs stable, tolerating regular diet well, passing gas, voiding without diff., pain controlled with oral pain meds..  Discharge instructions given to patient, prescriptions/work note given.  Patient discharged to home.

## 2015-10-29 ENCOUNTER — Ambulatory Visit (INDEPENDENT_AMBULATORY_CARE_PROVIDER_SITE_OTHER): Payer: Self-pay | Admitting: Internal Medicine

## 2015-10-29 VITALS — BP 116/80 | HR 74 | Ht 65.0 in | Wt 168.0 lb

## 2015-10-29 DIAGNOSIS — Z23 Encounter for immunization: Secondary | ICD-10-CM

## 2015-10-29 DIAGNOSIS — M545 Low back pain: Secondary | ICD-10-CM

## 2015-10-29 DIAGNOSIS — F418 Other specified anxiety disorders: Secondary | ICD-10-CM

## 2015-10-29 DIAGNOSIS — R1032 Left lower quadrant pain: Secondary | ICD-10-CM

## 2015-10-29 MED ORDER — CYCLOBENZAPRINE HCL 10 MG PO TABS: ORAL_TABLET | ORAL | 0 refills | Status: AC

## 2015-10-29 MED ORDER — POLYETHYLENE GLYCOL 3350 17 GM/SCOOP PO POWD: 17.0000 g | Freq: Every day | ORAL | 6 refills | Status: AC

## 2015-10-29 NOTE — Progress Notes (Signed)
Subjective:    Patient ID: Judy Richardson, female    DOB: 1985-11-28, 30 y.o.   MRN: [id number]  HPI   1.  Left lower abdominal pain:  Has had problems on and off for years, but more of a problem since appendectomy in September 2016. Has a problem with constipation in 2008 when incarcerated.  Was told she was dehydrated and in infirmary for 3 days. Redeveloped constipation after her appendectomy, was taking hydrocodone after the surgery, but the constipation did not seem to resolve after stopping the medication. Used to have daily formed soft stools.  Now can have hard pellets and go for a few days without a stool.   Breakfast:  Banana, bacon sandwich or may have tomatoes and eggs with this sandwich.  Uses white bread.  OJ.   May have a snack of peanuts or trail mix Lunch: skips sometimes, may have sandwich with meat or hot dog, PBJ.  Chips, [employer], water. Dinner: Spaghetti with meat sauce or other meat, salad, vegetable.  Fruit juice or soda, Kool Aid. Also states she was told she has noncancerous cervical polyp 2012 0r 2013.  Never followed through with follow up. No melena or hematochezia, though has had scant blood on tissue after straining to have a BM.  No hemorrhoids she is aware of.  Has tried unknown stool softener, and thinks it may have helped.    2.  Lower bilateral back pain, left side more prominent. Has been a problem for at least a year.  August of 2016, remembers slipping while carrying a bed and tweaking her back with pain developing 2 days later.   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   Does get numbness, tingling and weakness as well as pain down front of right leg more so than left all the way to toes.  Right upper hip also bothers her. Has never had back evaluated.  3.  Depression/Anxiety:  The latter since a kid, introvert.  Meds:  None  No Known Allergies   Past Medical  History:  Diagnosis Date  . Appendicitis   . Fibroids   . Ovarian cyst     Past Surgical History:  Procedure Laterality Date  . KNEE SURGERY Right   . LAPAROSCOPIC APPENDECTOMY  09/28/14   Dr. Hassell Done - [facility]  . LAPAROSCOPIC APPENDECTOMY N/A 09/28/2014   Procedure: APPENDECTOMY LAPAROSCOPIC;  Surgeon: Johnathan Hausen, MD;  Location: [facility];  Service: General;  Laterality: N/A;            Review of Systems     Objective:   Physical Exam NAD HEENT:  PERRL, EOMI, throat without injection Neck:  Supple, No adenopathy Chest:  CTA CV:  RRR with normal S1 and S2 no S#, S$ or murmur, radial and DP pulses normal and equal Back:  nontender over spinous processes, but mild paraspinous muscle tenderness in L/S area.   Abd:  S, + BS, No HSM or mass, NT Neuro:  A & O x 3, CN II-XII grossly intact, DTRs 2+/ 4 and Motor 5/5  Throughout. Gait normal LE:  No edema       Assessment & Plan:   1.   LLQ pain:  Likely constipation.  Had CT of abdomen pelvis about 1 year ago without findings in this area.   Discussed at length healthy diet with increased fiber. Start Miralax daily as well. Return in 1 week for  fasting labs:  CBC, CMp, UA for this evaluation.  2.  Low back pain:  Cyclobenzaprine.  PT referral  3.  Anxiety/depression:  Will address more at follow up.  She will contemplate referral to [facility].  4.  HM:  Tdap today.  To return for FLP/other fasting labs above.

## 2015-10-29 NOTE — Patient Instructions (Signed)
Drink a glass of water before every meal Drink 6-8 glasses of water daily Eat three meals daily Eat a protein and healthy fat with every meal (eggs,fish, chicken, [place] and limit red meats) Eat 5 servings of vegetables daily, mix the colors Eat 2 servings of fruit daily with skin, if skin is edible Use smaller plates Put food/utensils down as you chew and swallow each bite Eat at a table with friends/family at least once daily, no TV Do not eat in front of the TV  Avoid a lot of starches:  Rice, bread, pasta, potatoes without skin, bananas

## 2015-12-06 ENCOUNTER — Encounter: Payer: Self-pay | Admitting: Internal Medicine

## 2018-10-04 ENCOUNTER — Emergency Department (HOSPITAL_BASED_OUTPATIENT_CLINIC_OR_DEPARTMENT_OTHER)
Admission: EM | Admit: 2018-10-04 | Discharge: 2018-10-04 | Disposition: A | Discharge status: Home / Self Care | Payer: Self-pay | Attending: Emergency Medicine | Admitting: Emergency Medicine

## 2018-10-04 ENCOUNTER — Emergency Department (HOSPITAL_BASED_OUTPATIENT_CLINIC_OR_DEPARTMENT_OTHER): Payer: Self-pay

## 2018-10-04 ENCOUNTER — Encounter (HOSPITAL_BASED_OUTPATIENT_CLINIC_OR_DEPARTMENT_OTHER): Payer: Self-pay | Admitting: Emergency Medicine

## 2018-10-04 ENCOUNTER — Other Ambulatory Visit: Payer: Self-pay

## 2018-10-04 DIAGNOSIS — F17210 Nicotine dependence, cigarettes, uncomplicated: Secondary | ICD-10-CM | POA: Insufficient documentation

## 2018-10-04 DIAGNOSIS — R4189 Other symptoms and signs involving cognitive functions and awareness: Secondary | ICD-10-CM | POA: Insufficient documentation

## 2018-10-04 DIAGNOSIS — F1210 Cannabis abuse, uncomplicated: Secondary | ICD-10-CM | POA: Insufficient documentation

## 2018-10-04 DIAGNOSIS — D251 Intramural leiomyoma of uterus: Secondary | ICD-10-CM | POA: Insufficient documentation

## 2018-10-04 DIAGNOSIS — N390 Urinary tract infection, site not specified: Secondary | ICD-10-CM | POA: Insufficient documentation

## 2018-10-04 HISTORY — DX: Unspecified psychosis not due to a substance or known physiological condition: F29

## 2018-10-04 HISTORY — DX: Unspecified injury of head, initial encounter: S09.90XA

## 2018-10-04 HISTORY — DX: Cervicalgia: M54.2

## 2018-10-04 HISTORY — DX: Unspecified abnormal cytological findings in specimens from cervix uteri: R87.619

## 2018-10-04 LAB — URINALYSIS, ROUTINE W REFLEX MICROSCOPIC
Bilirubin Urine: NEGATIVE
Glucose, UA: NEGATIVE mg/dL
Hgb urine dipstick: NEGATIVE
Ketones, ur: NEGATIVE mg/dL
Nitrite: NEGATIVE
Protein, ur: NEGATIVE mg/dL
Specific Gravity, Urine: 1.02 (ref 1.005–1.030)
pH: 6 (ref 5.0–8.0)

## 2018-10-04 LAB — PREGNANCY, URINE: Preg Test, Ur: NEGATIVE

## 2018-10-04 LAB — URINALYSIS, MICROSCOPIC (REFLEX)

## 2018-10-04 MED ORDER — ONDANSETRON HCL 4 MG/2ML IJ SOLN
4.0000 mg | Freq: Once | INTRAMUSCULAR | Status: DC
  Filled 2018-10-04 (×2): qty 2

## 2018-10-04 MED ORDER — FENTANYL CITRATE (PF) 100 MCG/2ML IJ SOLN
50.0000 ug | Freq: Once | INTRAMUSCULAR | Status: DC
  Filled 2018-10-04 (×2): qty 2

## 2018-10-04 MED ORDER — CEPHALEXIN 500 MG PO CAPS: 500.0000 mg | ORAL_CAPSULE | Freq: Two times a day (BID) | ORAL | 0 refills | Status: AC

## 2018-10-04 MED ORDER — SODIUM CHLORIDE 0.9 % IV BOLUS: 1000.0000 mL | Freq: Once | INTRAVENOUS | Status: DC

## 2018-10-04 NOTE — ED Triage Notes (Signed)
Reports flank pain and ab pain for a couple days. States feeling dizzy last night at work. Reports nausea and vomiting. "Weird smell" to urine. Last BM yesterday.

## 2018-10-04 NOTE — ED Notes (Signed)
Pt called out stating that she changed her mind about the pain medicine and she wanted it now. RN withdrew meds from pyxis again and brought to bedside again. Pt to list numbers, then said "that's when they took the twin towers." RN explained to IV access procedure and pain medications. Attempted access in [facility], unable to thread catheter due to scar tissue. Successful attempt in R wrist. Pt states "that's a weird place for that." RN explained benefits of IV site. Pt states "I feel like this is some suicide squad shit." Wants IV removed. Asked patient if she would like her pain medication first. Pt wants IV out. Removed at her request, returned medications to pyxis again, notified provider.

## 2018-10-04 NOTE — ED Notes (Addendum)
Provided D/C instructions and encouraged to follow up with OB/GYN. States "Now I have to go there" - states that the only OB/GYN in the country is in [place]. Explained to patient that there are OBs here in [place] that she can see. Pt then states that people in [place] are liars. Explained that I was not lying to her. Pt furrowed her brows and states "we're all niggers, this is [place]." Provided RX x1, escorted to lobby. Offered assistance getting home, as patient arrived via ambulance. RN asked pt how she plans on getting home, pt laughed and states "my two feet." Asked patient where her home is, refused to answer - pt suspicious of RN. Unable to provide ride for patient if she will not provide address.

## 2018-10-04 NOTE — ED Notes (Signed)
Requesting water to provide urine sample. Okayed with provider, brought water to bedside. Pt then states that she wants to give a clean sample, and goes to provide urine without drinking water. Doesn't make eye contact with staff.

## 2018-10-04 NOTE — ED Notes (Addendum)
Pt refused medications, states "why would I want pain medicine?" She reported in triage that pain was 7/10 when she was moving around.

## 2018-10-04 NOTE — ED Provider Notes (Signed)
[place] DEPT [facility] Provider Note: Judy Spurling, MD, FACEP  CSN: [id number] MRN: [id number] ARRIVAL: 10/04/18 at [place]: [place]  Abdominal Pain and Flank Pain   HISTORY OF PRESENT ILLNESS  10/04/18 5:05 AM Judy Richardson is a 33 y.o. female with left flank pain radiating to the left lower quadrant since yesterday morning.  The pain is rated about a 4-5 out of 10 presently but can be as severe as a 7 out of 10 with certain positions.  There has been associated nausea and vomiting.  There has been no fever, hematuria or dysuria.  There has been an abnormal odor to the urine.   Past Medical History:  Diagnosis Date  . Abnormal Pap smear of cervix    reports she never went back to have further eval  . Appendicitis   . Fibroids   . Head injury   . Neck pain   . Ovarian cyst   . Psychosis [facility])     Past Surgical History:  Procedure Laterality Date  . KNEE SURGERY Right   . LAPAROSCOPIC APPENDECTOMY N/A 09/28/2014   Procedure: APPENDECTOMY LAPAROSCOPIC;  Surgeon: Johnathan Hausen, MD;  Location: [facility];  Service: General;  Laterality: N/A;    No family history on file.  Social History   Tobacco Use  . Smoking status: Current Every Day Smoker    Types: Cigarettes  . Smokeless tobacco: Never Used  Substance Use Topics  . Alcohol use: Yes  . Drug use: Yes    Types: Marijuana    Comment: Daily.     Prior to Admission medications   Medication Sig Start Date End Date Taking? Authorizing Provider  cephALEXin (KEFLEX) 500 MG capsule Take 1 capsule (500 mg total) by mouth 2 (two) times daily for 3 days. 10/04/18 10/07/18  Marlo Goodrich, MD  cyclobenzaprine (FLEXERIL) 10 MG tablet 1/2 to 1 tab at bedtime for back pain. 10/29/15   Mack Hook, MD  polyethylene glycol powder (GLYCOLAX/MIRALAX) powder Take 17 g by mouth daily. In 8 oz juice or water 10/29/15   Mack Hook, MD    Allergies Citrus, Eggs or egg-derived products, and Lactose  intolerance (gi)   REVIEW OF SYSTEMS  Negative except as noted here or in the History of Present Illness.   PHYSICAL EXAMINATION  Initial Vital Signs Blood pressure (!) 149/94, pulse 74, resp. rate 16, height 5\' 5"  (1.651 m), weight 72.6 kg, last menstrual period 09/26/2018, SpO2 98 %.  Examination General: Well-developed, well-nourished female in no acute distress; appearance consistent with age of record HENT: normocephalic; atraumatic Eyes: pupils equal, round and reactive to light; extraocular muscles intact Neck: supple Heart: regular rate and rhythm Lungs: clear to auscultation bilaterally Abdomen: soft; nondistended; left lower quadrant tenderness; bowel sounds present GU: No CVA tenderness Extremities: No deformity; full range of motion; pulses normal Neurologic: Awake, alert and oriented; motor function intact in all extremities and symmetric; no facial droop Skin: Warm and dry Psychiatric: Inappropriate answers to questions   RESULTS  Summary of this visit's results, reviewed by myself:   EKG Interpretation  Date/Time:    Ventricular Rate:    PR Interval:    QRS Duration:   QT Interval:    QTC Calculation:   R Axis:     Text Interpretation:        Laboratory Studies: Results for orders placed or performed during the hospital encounter of 10/04/18 (from the past 24 hour(s))  Urinalysis, Routine w reflex microscopic  Status: Abnormal   Collection Time: 10/04/18  5:32 AM  Result Value Ref Range   Color, Urine YELLOW YELLOW   APPearance HAZY (A) CLEAR   Specific Gravity, Urine 1.020 1.005 - 1.030   pH 6.0 5.0 - 8.0   Glucose, UA NEGATIVE NEGATIVE mg/dL   Hgb urine dipstick NEGATIVE NEGATIVE   Bilirubin Urine NEGATIVE NEGATIVE   Ketones, ur NEGATIVE NEGATIVE mg/dL   Protein, ur NEGATIVE NEGATIVE mg/dL   Nitrite NEGATIVE NEGATIVE   Leukocytes,Ua TRACE (A) NEGATIVE  Pregnancy, urine     Status: None   Collection Time: 10/04/18  5:32 AM  Result Value  Ref Range   Preg Test, Ur NEGATIVE NEGATIVE  Urinalysis, Microscopic (reflex)     Status: Abnormal   Collection Time: 10/04/18  5:32 AM  Result Value Ref Range   RBC / HPF 0-5 0 - 5 RBC/hpf   WBC, UA 6-10 0 - 5 WBC/hpf   Bacteria, UA MANY (A) NONE SEEN   Squamous Epithelial / LPF 0-5 0 - 5   Mucus PRESENT    Imaging Studies: Ct Renal Stone Study  Result Date: 10/04/2018 CLINICAL DATA:  Flank pain with stone disease suspected EXAM: CT ABDOMEN AND PELVIS WITHOUT CONTRAST TECHNIQUE: Multidetector CT imaging of the abdomen and pelvis was performed following the standard protocol without IV contrast. COMPARISON:  Abdominal CT 09/28/2014 FINDINGS: Lower chest:  No contributory findings. Hepatobiliary: No focal liver abnormality visible on today's scan. There was a right hepatic low-density on prior CT which is not detectable today.No evidence of biliary obstruction or stone. Pancreas: Unremarkable. Spleen: Unremarkable. Adrenals/Urinary Tract: Negative adrenals. No hydronephrosis or stone. Unremarkable bladder. Stomach/Bowel:  No obstruction. No appendicitis. Vascular/Lymphatic: No acute vascular abnormality. No mass or adenopathy. Reproductive:Posterior uterine fibroid measuring nearly 5.5 cm, increased from 2016. Other: No ascites or pneumoperitoneum. Musculoskeletal: No acute abnormalities. IMPRESSION: 1. No acute finding.  No hydronephrosis or urolithiasis. 2. 5.5 cm intramural fibroid. Electronically Signed   By: Monte Fantasia M.D.   On: 10/04/2018 06:23    ED COURSE and MDM  Nursing notes and initial vitals signs, including pulse oximetry, reviewed.  Vitals:   10/04/18 0453 10/04/18 0458 10/04/18 0501  BP:   (!) 149/94  Pulse:   74  Resp:   16  Temp:   98.4 F (36.9 C)  TempSrc:   Oral  SpO2: 97%  98%  Weight:  72.6 kg   Height:  5\' 5"  (1.651 m)    5:27 AM Patient refuses an IV and refuses any medications.  She told her nurse "I just want to know what is wrong with me".  A review  of her records indicates she was hospitalized at [facility] for a psychotic episode in April of this year.  Her nurse here reports paranoid behavior including suspicion of administered medications being poisoned.   6:28 AM CT scan is unremarkable except for a uterine fibroid.  Her urinalysis is suggestive of an early urinary tract infection and we will treat this.  PROCEDURES    ED DIAGNOSES     ICD-10-CM   1. Lower urinary tract infectious disease  N39.0   2. Intramural leiomyoma of uterus  D25.1   3. Thought disorder  R41.89        Miking Usrey, Jenny Reichmann, MD 10/04/18 [phone]

## 2021-05-17 IMAGING — CT CT RENAL STONE PROTOCOL
2 of 4 series · 17 of 46 positions shown, 19 images · non-contrast
Comparison: Abdominal CT 09/28/2014

CLINICAL DATA: Flank pain with stone disease suspected

EXAM:
CT ABDOMEN AND PELVIS WITHOUT CONTRAST
TECHNIQUE: Multidetector CT imaging of the abdomen and pelvis was performed
following the standard protocol without IV contrast.

[Series 2: axial st · axial · 0.77mm/px · z∈[-442,-22]mm · 14 of 92 slices shown, 16 images]
[im 4/92  soft-tissue]
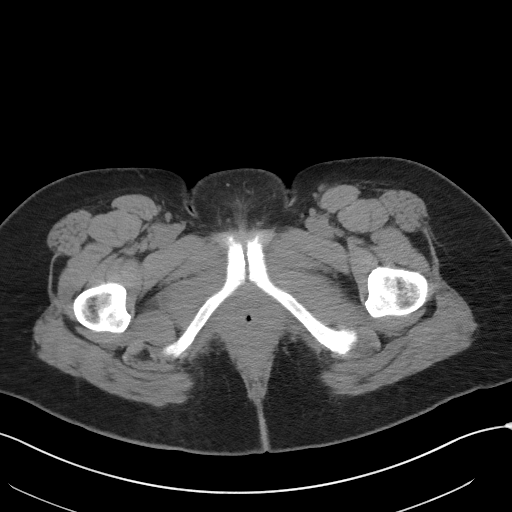
[im 4/92  bone]
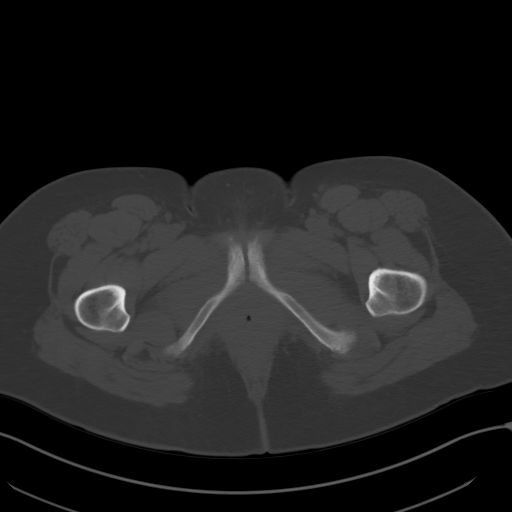
[im 12/92  soft-tissue]
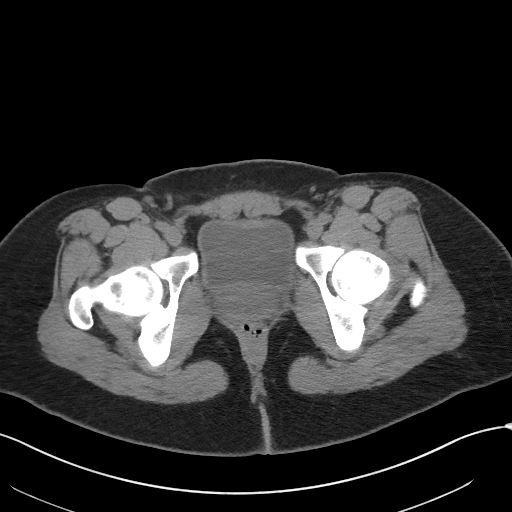
[im 16/92  soft-tissue]
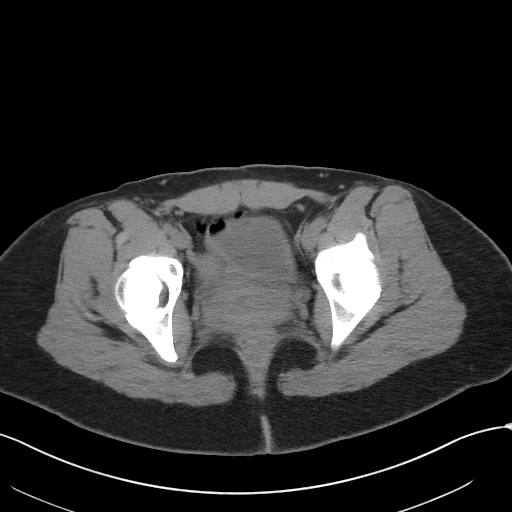
[im 24/92  soft-tissue]
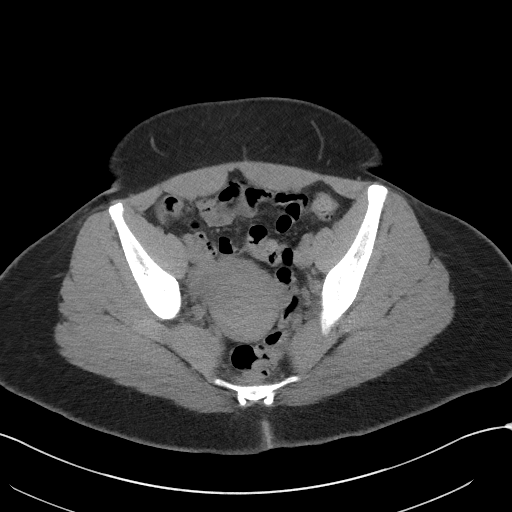
[im 32/92  soft-tissue]
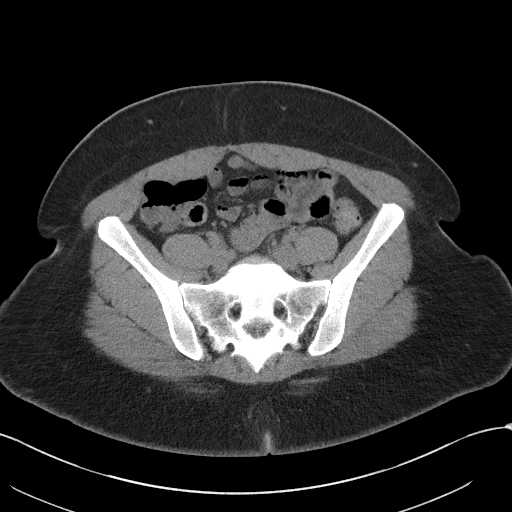
[im 36/92  soft-tissue]
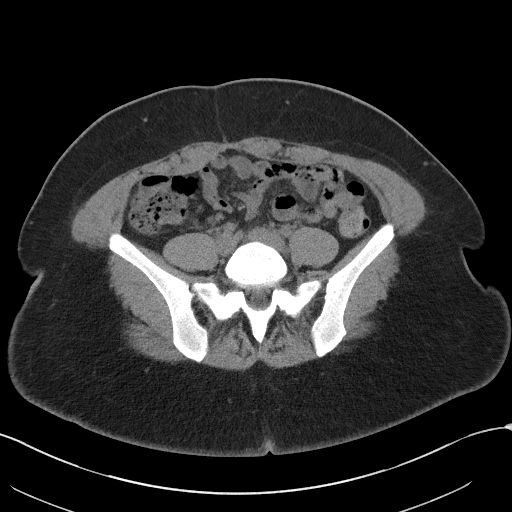
[im 44/92  soft-tissue]
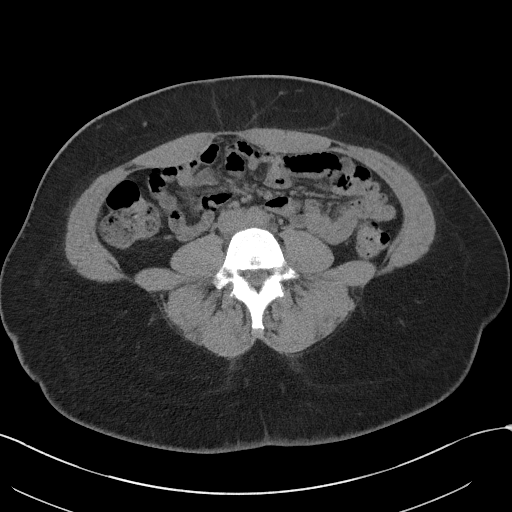
[im 48/92  soft-tissue]
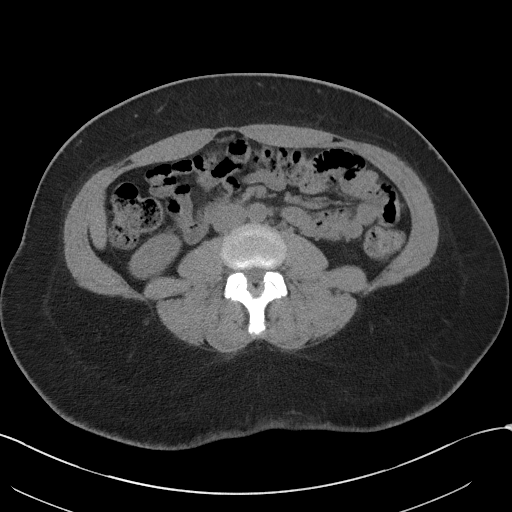
[im 56/92  soft-tissue]
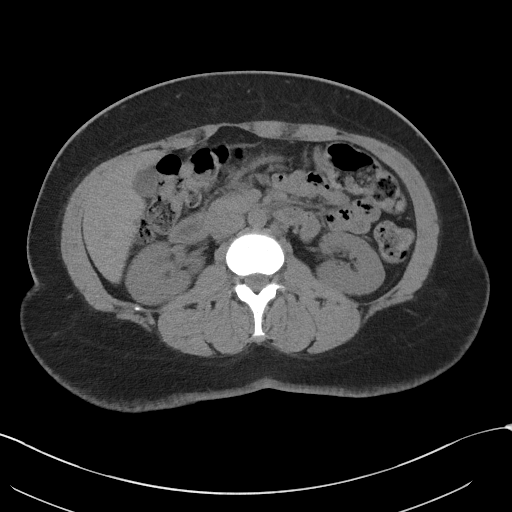
[im 56/92  bone]
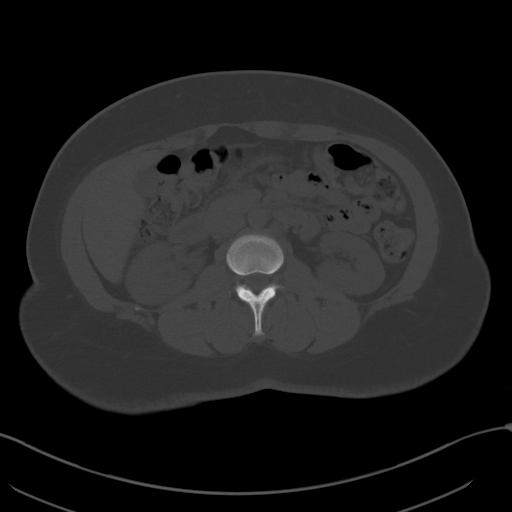
[im 60/92  soft-tissue]
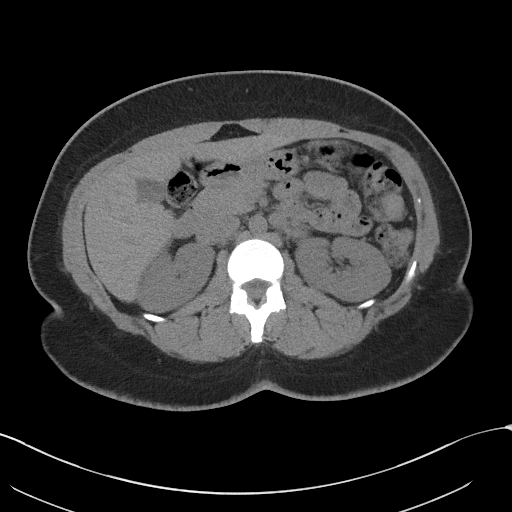
[im 68/92  soft-tissue]
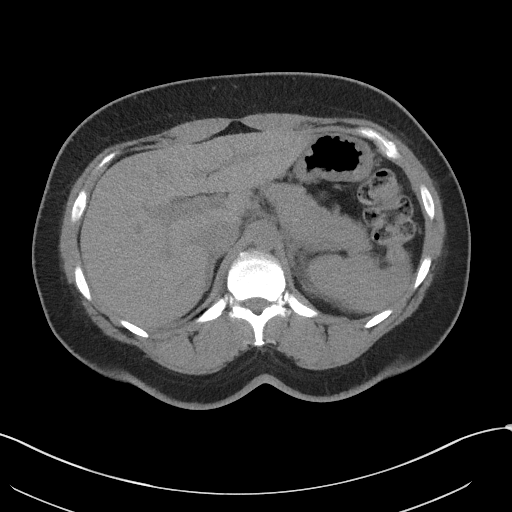
[im 76/92  soft-tissue]
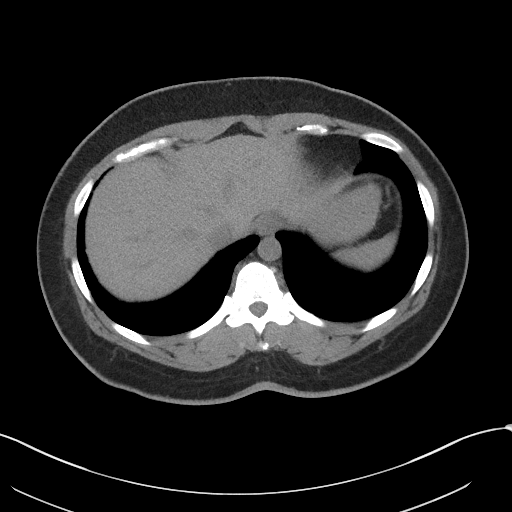
[im 80/92  soft-tissue]
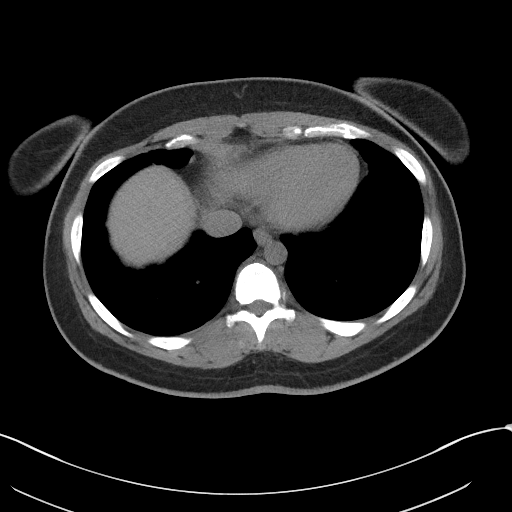
[im 88/92  soft-tissue]
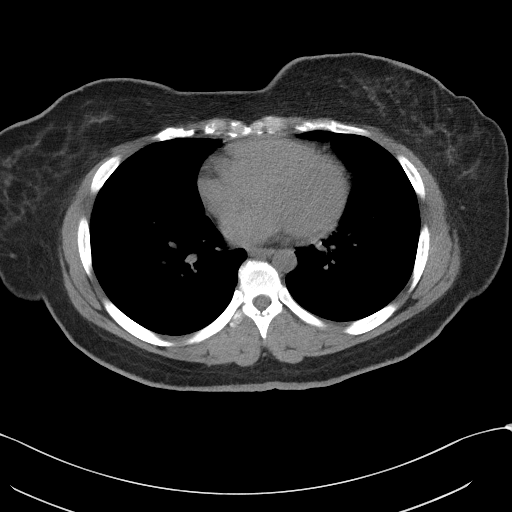

[Series 4: coronal st · coronal · 0.93mm/px · 3 of 88 slices shown]
[im 30/88  soft-tissue]
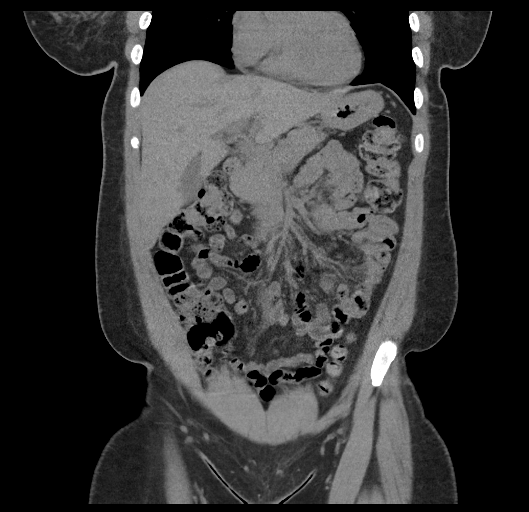
[im 39/88  soft-tissue]
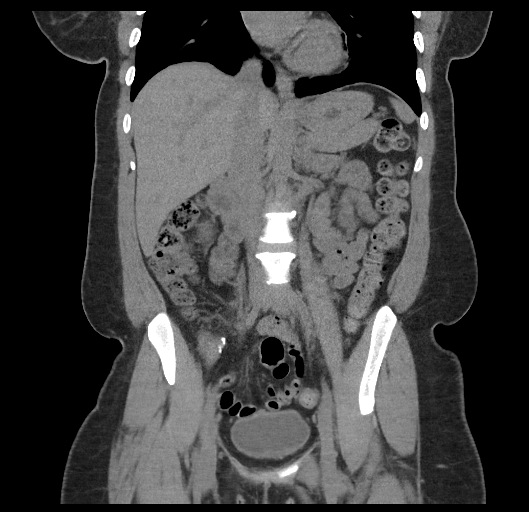
[im 49/88  soft-tissue]
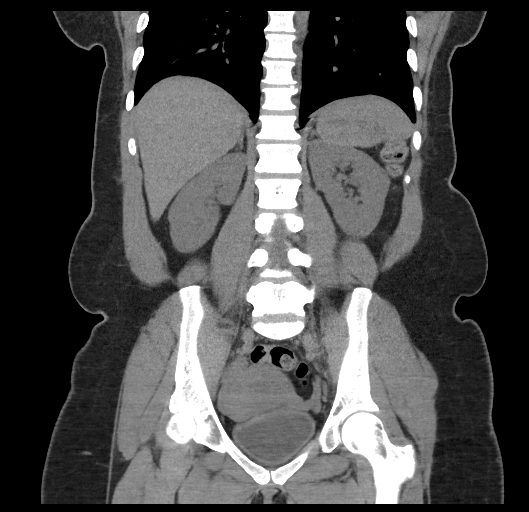

[17 of 46 positions shown; findings below may reference images not displayed]

FINDINGS: Lower chest:  No contributory findings.

Hepatobiliary: No focal liver abnormality visible on today's scan.
There was a right hepatic low-density on prior CT which is not
detectable today.No evidence of biliary obstruction or stone.

Pancreas: Unremarkable.

Spleen: Unremarkable.

Adrenals/Urinary Tract: Negative adrenals. No hydronephrosis or
stone. Unremarkable bladder.

Stomach/Bowel:  No obstruction. No appendicitis.

Vascular/Lymphatic: No acute vascular abnormality. No mass or
adenopathy.

Reproductive:Posterior uterine fibroid measuring nearly 5.5 cm,
increased from 6054.

Other: No ascites or pneumoperitoneum.

Musculoskeletal: No acute abnormalities.
IMPRESSION: 1. No acute finding.  No hydronephrosis or urolithiasis.
2. 5.5 cm intramural fibroid.
